# Patient Record
Sex: Female | Born: 1995 | Race: Black or African American | Hispanic: No | Marital: Single | Smoking: Never smoker
Health system: Southern US, Community
[De-identification: ages and names within clinical notes are randomized; demographics above are authoritative.]

## PROBLEM LIST (undated history)

## (undated) DIAGNOSIS — R632 Polyphagia: Secondary | ICD-10-CM

## (undated) DIAGNOSIS — F909 Attention-deficit hyperactivity disorder, unspecified type: Secondary | ICD-10-CM

## (undated) DIAGNOSIS — E669 Obesity, unspecified: Secondary | ICD-10-CM

## (undated) HISTORY — PX: NO PAST SURGERIES: SHX2092

---
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
## 2018-02-22 ENCOUNTER — Encounter: Payer: Self-pay | Admitting: Emergency Medicine

## 2018-02-22 ENCOUNTER — Emergency Department: Payer: Medicaid Other

## 2018-02-22 ENCOUNTER — Other Ambulatory Visit: Payer: Self-pay

## 2018-02-22 ENCOUNTER — Emergency Department
Admission: EM | Admit: 2018-02-22 | Discharge: 2018-02-22 | Disposition: A | Discharge status: Home / Self Care | Payer: Medicaid Other | Attending: Emergency Medicine | Admitting: Emergency Medicine

## 2018-02-22 DIAGNOSIS — M778 Other enthesopathies, not elsewhere classified: Secondary | ICD-10-CM | POA: Diagnosis not present

## 2018-02-22 DIAGNOSIS — M25532 Pain in left wrist: Secondary | ICD-10-CM | POA: Diagnosis present

## 2018-02-22 MED ORDER — INDOMETHACIN 25 MG PO CAPS: 25.0000 mg | ORAL_CAPSULE | Freq: Two times a day (BID) | ORAL | 0 refills | Status: DC

## 2018-02-22 NOTE — ED Triage Notes (Signed)
Pt to ED via POV c/o left wrist pain x 1.5 weeks. Pt denies any known injury. Pt in NAD at this time.

## 2018-02-22 NOTE — ED Provider Notes (Signed)
[facility] Emergency Department Provider Note  ____________________________________________   First MD Initiated Contact with Patient 02/22/18 [phone]     (approximate)  I have reviewed the triage vital signs and the nursing notes.   HISTORY  Chief Complaint Wrist Pain   HPI Deborah Malone is a 22 y.o. female presents to the ED with complaint of left wrist pain for 1/2 weeks.  Patient states that there is been no history of injury.  Patient states that the ulnar aspect of her left wrist is painful especially with range of motion.  Is been taking acetaminophen without any relief of her pain.  Patient is left-hand dominant.  She denies any history of injury.  She rates her pain as 4/10.   History reviewed. No pertinent past medical history.  There are no active problems to display for this patient.   History reviewed. No pertinent surgical history.  Prior to Admission medications   Medication Sig Start Date End Date Taking? Authorizing Provider  indomethacin (INDOCIN) 25 MG capsule Take 1 capsule (25 mg total) by mouth 2 (two) times daily with a meal. 02/22/18   Tommi Rumps, PA-C    Allergies Patient has no known allergies.  No family history on file.  Social History Social History   Tobacco Use  . Smoking status: Never Smoker  . Smokeless tobacco: Never Used  Substance Use Topics  . Alcohol use: Not Currently  . Drug use: Not Currently    Review of Systems Constitutional: No fever/chills Cardiovascular: Denies chest pain. Respiratory: Denies shortness of breath. Musculoskeletal: Left hand pain. Skin: Negative for rash. Neurological: Negative for headaches, focal weakness or numbness. ___________________________________________   PHYSICAL EXAM:  VITAL SIGNS: ED Triage Vitals  Enc Vitals Group     BP 02/22/18 0756 131/68     Pulse Rate 02/22/18 0755 88     Resp 02/22/18 0755 16     Temp 02/22/18 0755 98.1 F (36.7 C)   Temp Source 02/22/18 0755 Oral     SpO2 02/22/18 0755 100 %     Weight 02/22/18 0755 269 lb (122 kg)     Height 02/22/18 0755  (1.753 m)     Head Circumference --      Peak Flow --      Pain Score 02/22/18 0755 4     Pain Loc --      Pain Edu? --      Excl. in GC? --    Constitutional: Alert and oriented. Well appearing and in no acute distress. Eyes: Conjunctivae are normal. Head: Atraumatic. Neck: No stridor.   Cardiovascular: Normal rate, regular rhythm. Grossly normal heart sounds.  Good peripheral circulation. Respiratory: Normal respiratory effort.  No retractions. Lungs CTAB. Musculoskeletal: On examination of left wrist there is no gross deformity and no erythema or warmth noted.  Range of motion is slightly restricted with extension reproducing the pain.  There is some minimal point tenderness on palpation on the ulnar aspect of her left wrist.  Motor sensory function intact to digits distal to this area.  Capillary refill is less than 3 seconds and skin is intact without any erythema. Neurologic:  Normal speech and language. No gross focal neurologic deficits are appreciated. No gait instability. Skin:  Skin is warm, dry and intact. No rash noted. Psychiatric: Mood and affect are normal. Speech and behavior are normal.  ____________________________________________   LABS (all labs ordered are listed, but only abnormal results are displayed)  Labs  Reviewed - No data to display  RADIOLOGY  ED MD interpretation:  Left wrist x-ray is negative.  Official radiology report(s): Dg Wrist Complete Left  Result Date: 02/22/2018 CLINICAL DATA:  Pain for 2 weeks EXAM: LEFT WRIST - COMPLETE 3+ VIEW COMPARISON:  None. FINDINGS: Frontal, oblique lateral, and ulnar deviation scaphoid images were obtained. There is no fracture or dislocation. The joint spaces appear normal. No erosive change or intra-articular calcification. IMPRESSION: No fracture or dislocation.  No evident  arthropathy. Electronically Signed   By: Bretta Bang III M.D.   On: 02/22/2018 08:34    ____________________________________________   PROCEDURES  Procedure(s) performed: None  Procedures  Critical Care performed: No  ____________________________________________   INITIAL IMPRESSION / ASSESSMENT AND PLAN / ED COURSE  As part of my medical decision making, I reviewed the following data within the electronic MEDICAL RECORD NUMBER Notes from prior ED visits and [place] Controlled Substance Database  Patient is here with complaint of left wrist pain x 1.5 weeks.  Patient was placed in a cock-up wrist splint and instructed to wear it for protection and comfort.  She was given a prescription for indomethacin 25 mg twice daily with food.  She is instructed to discontinue taking this medications.  She experience any GI side effects.  She will follow-up with her PCP in [place] if any continued problems.   ____________________________________________   FINAL CLINICAL IMPRESSION(S) / ED DIAGNOSES  Final diagnoses:  Left wrist tendonitis     ED Discharge Orders        Ordered    indomethacin (INDOCIN) 25 MG capsule  2 times daily with meals     02/22/18 0845       Note:  This document was prepared using Dragon voice recognition software and may include unintentional dictation errors.    Tommi Rumps, PA-C 02/22/18 8413    Jene Every, MD 02/22/18 [phone]

## 2018-02-22 NOTE — Discharge Instructions (Signed)
Taking medication twice a day with food as directed.  Indocin can cause stomach upset to discontinue if any nausea, vomiting or diarrhea.  Also discontinue immediately if you see any black stools.  We are cock-up wrist splint for support and protection.  You may ice as needed for discomfort.  Follow-up with your primary care doctor at [facility] if any continued problems after 1 week.

## 2018-02-22 NOTE — ED Notes (Signed)
See triage note  Presents with a 2 week hx of left wrist pain  Denies any injury that she is aware of  Stats she does a lot of repetitive motion and lifting  Has also had some intermittent swelling  No deformity noted  Good pulses

## 2019-01-25 IMAGING — DX DG WRIST COMPLETE 3+V*L*
4 series · 4 of 4 positions shown · non-contrast
Comparison: None.

CLINICAL DATA: Pain for 2 weeks

EXAM:
LEFT WRIST - COMPLETE 3+ VIEW

[wrist ap (1 of 2)]
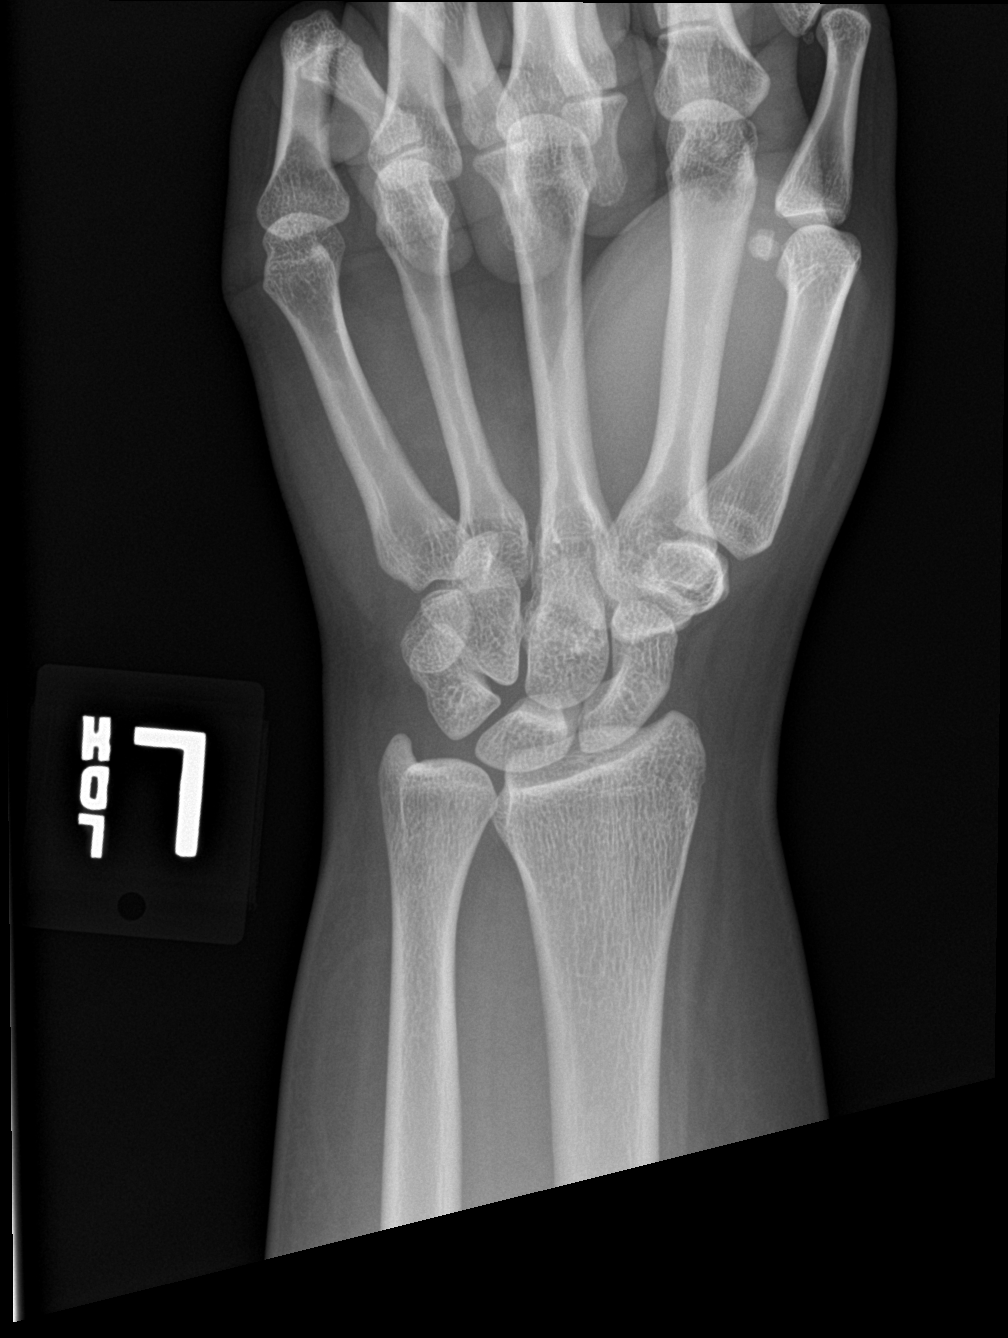

[wrist obl]
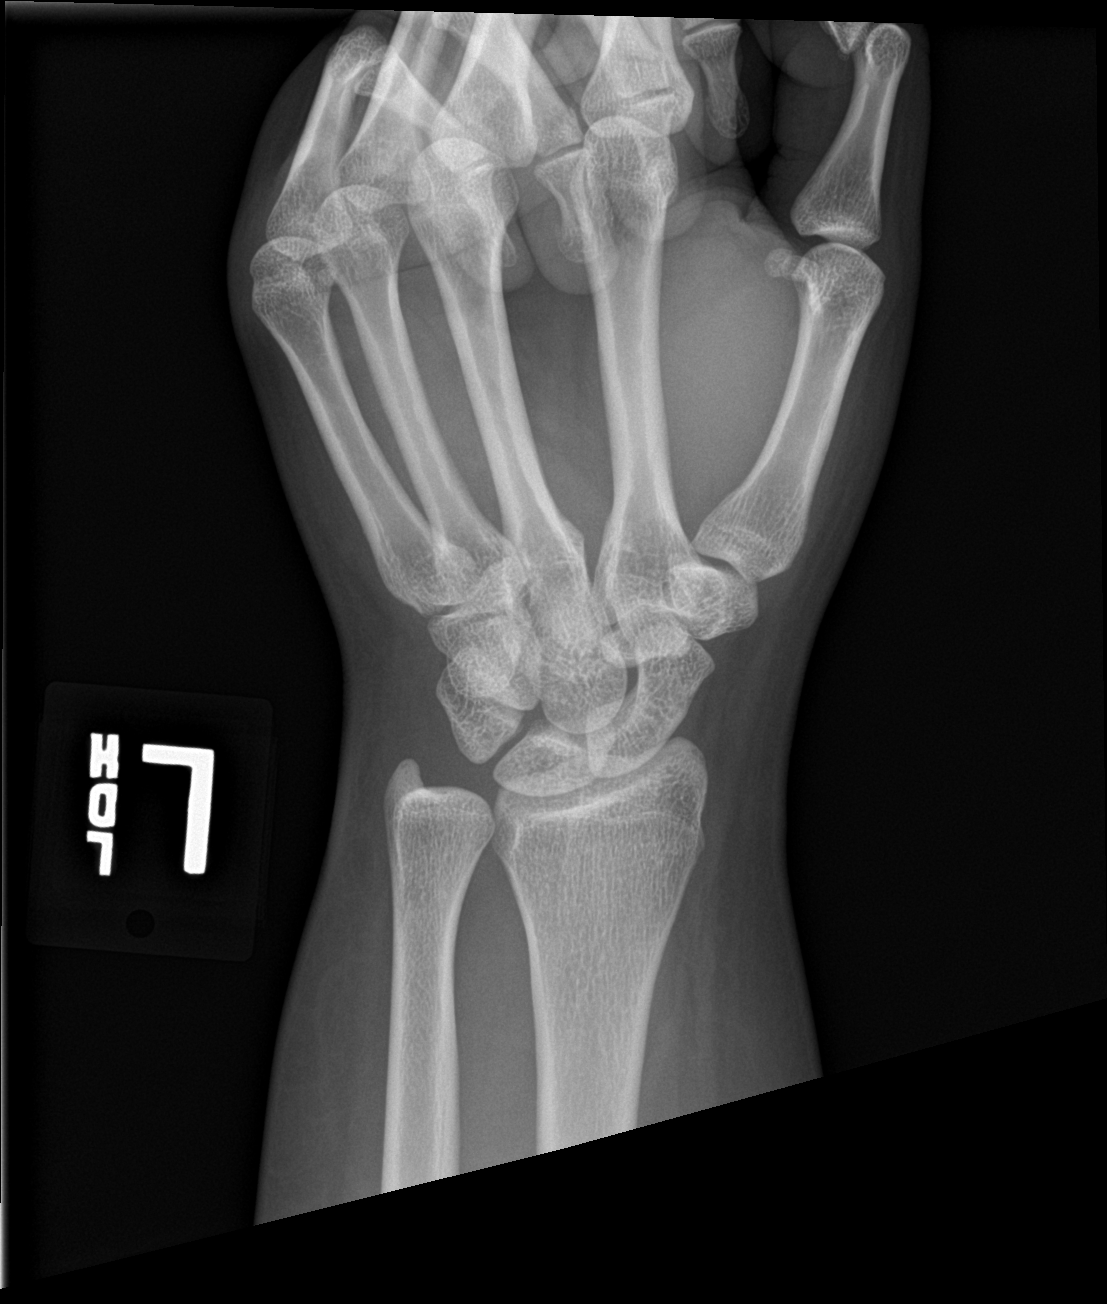

[wrist lat]
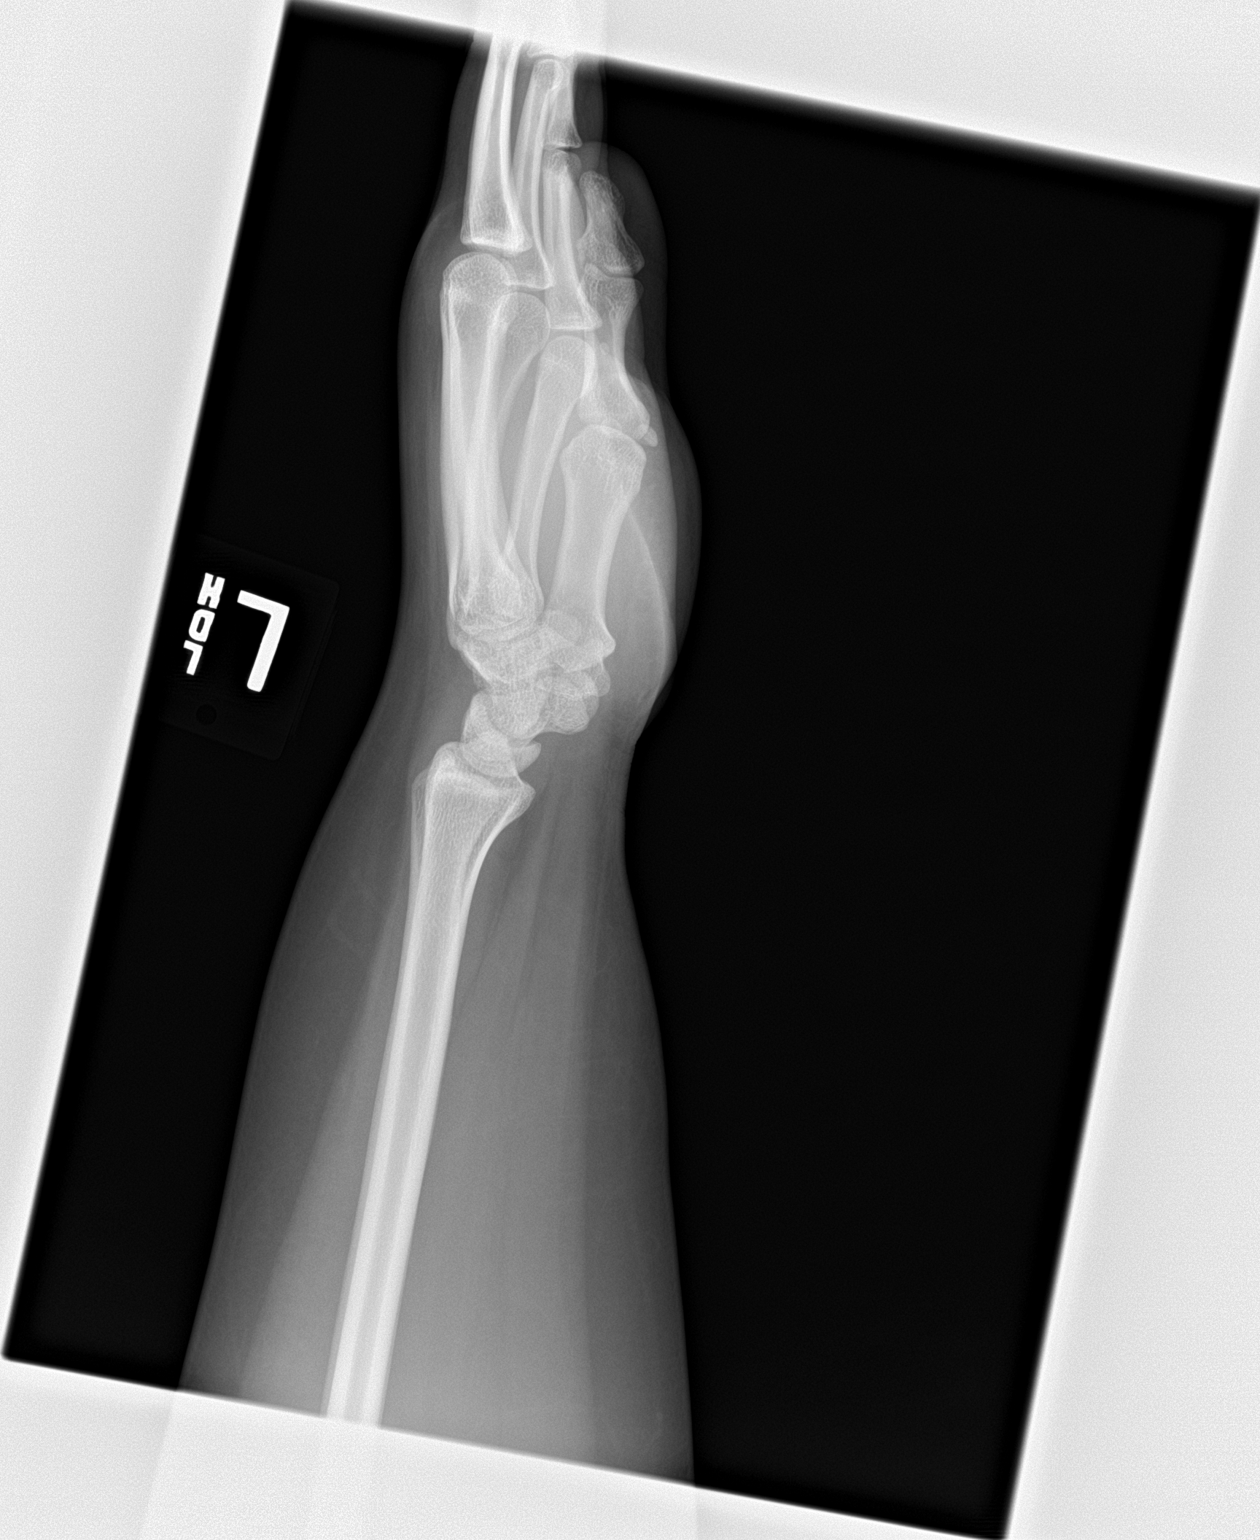

[wrist ap (2 of 2)]
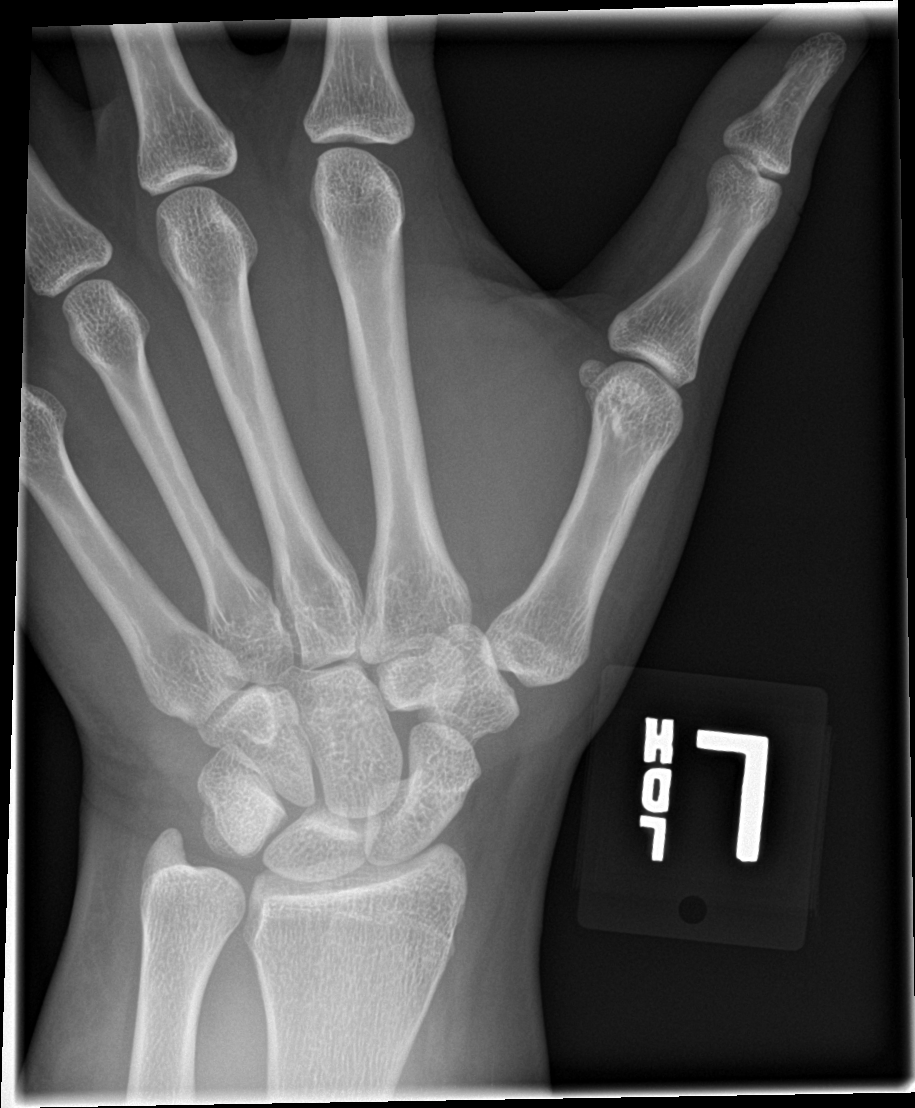

[4 of 4 positions shown; findings below may reference images not displayed]

FINDINGS: Frontal, oblique lateral, and ulnar deviation scaphoid images were
obtained. There is no fracture or dislocation. The joint spaces
appear normal. No erosive change or intra-articular calcification.
IMPRESSION: No fracture or dislocation.  No evident arthropathy.

## 2019-08-10 ENCOUNTER — Other Ambulatory Visit: Payer: Self-pay

## 2019-08-10 ENCOUNTER — Encounter: Payer: Self-pay | Admitting: Emergency Medicine

## 2019-08-10 ENCOUNTER — Ambulatory Visit
Admission: EM | Admit: 2019-08-10 | Discharge: 2019-08-10 | Disposition: A | Discharge status: Home / Self Care | Payer: Medicaid Other | Attending: Family Medicine | Admitting: Family Medicine

## 2019-08-10 DIAGNOSIS — J039 Acute tonsillitis, unspecified: Secondary | ICD-10-CM | POA: Insufficient documentation

## 2019-08-10 LAB — RAPID STREP SCREEN (MED CTR MEBANE ONLY): Streptococcus, Group A Screen (Direct): NEGATIVE

## 2019-08-10 MED ORDER — AMOXICILLIN 500 MG PO CAPS: 500.0000 mg | ORAL_CAPSULE | Freq: Two times a day (BID) | ORAL | 0 refills | Status: DC

## 2019-08-10 NOTE — Discharge Instructions (Signed)
It was very nice seeing you today in clinic. Thank you for entrusting me with your care.   Rest and Increase fluid intake as much as possible. Water is always best, as sugar and caffeine containing fluids can cause you to become dehydrated. Try to incorporate electrolyte enriched fluids, such as Gatorade or Pedialyte, into your daily fluid intake.  May use Tylenol and/or Ibuprofen as needed for pain/fever.  Antibiotics have been sent in - FINISH ALL OF THE PILLS.  Make arrangements to follow up with your regular doctor in 1 week for re-evaluation if not improving. If your symptoms/condition worsens, please seek follow up care either here or in the ER. Please remember, our [place] providers are "right here with you" when you need [place].   Again, it was my pleasure to take care of you today. Thank you for choosing our clinic. I hope that you start to feel better quickly.   Honor Loh, MSN, APRN, FNP-C, CEN Advanced Practice Provider [facility]

## 2019-08-10 NOTE — ED Provider Notes (Signed)
[place], [place]   Name: Deborah Malone DOB: 07/23/1996 MRN: [id number] CSN: [id number] PCColleen Can: [facility] [place] [facility]  Arrival date and time:  08/10/19 1626  Chief Complaint:  Sore Throat   NOTE: Prior to seeing the patient today, I have reviewed the triage nursing documentation and vital signs. Clinical staff has updated patient's PMH/PSHx, current medication list, and drug allergies/intolerances to ensure comprehensive history available to assist in medical decision making.   History:   HPI: Deborah Malone is a 23 y.o. female who presents today with complaints of sore throat that began with acute onset this morning.  Patient denies any associated fevers.  She is not experiencing shortness of breath, wheezing/stridor, or difficulty swallowing. She denies that she has experienced any nausea, vomiting, diarrhea, or abdominal pain. She is eating and drinking well. Patient denies any perceived alterations to her sense of taste or smell. Patient denies being in close contact with anyone known to be ill. She has never been tested for SARS-CoV-2 (novel coronavirus) per her report. Despite her symptoms, patient has not taken any over the counter interventions to help improve/relieve her reported symptoms at home.   History reviewed. No pertinent past medical history.  Past Surgical History:  Procedure Laterality Date  . NO PAST SURGERIES      Family History  Problem Relation Age of Onset  . Anxiety disorder Mother   . Depression Mother   . Post-traumatic stress disorder Mother   . Bipolar disorder Mother   . Other Father        unknown medical history    Social History   Tobacco Use  . Smoking status: Never Smoker  . Smokeless tobacco: Never Used  Substance Use Topics  . Alcohol use: Not Currently  . Drug use: Not Currently    There are no active problems to display for this patient.   Home Medications:    No outpatient medications have been marked as taking for the 08/10/19  encounter [facility](Hospital Encounter).    Allergies:   Patient has no known allergies.  Review of Systems (ROS): Review of Systems  Constitutional: Negative for fatigue and fever.  HENT: Positive for sore throat. Negative for congestion, ear pain, postnasal drip, rhinorrhea, sinus pressure, sinus pain and sneezing.   Eyes: Negative for pain, discharge and redness.  Respiratory: Negative for cough, chest tightness and shortness of breath.   Cardiovascular: Negative for chest pain and palpitations.  Gastrointestinal: Negative for abdominal pain, diarrhea, nausea and vomiting.  Musculoskeletal: Negative for arthralgias, back pain, myalgias and neck pain.  Skin: Negative for color change, pallor and rash.  Neurological: Negative for dizziness, syncope, weakness and headaches.  Hematological: Negative for adenopathy.     Vital Signs: Today's Vitals   08/10/19 1637 08/10/19 1638 08/10/19 1710  BP:  132/75   Pulse:  (!) 102   Resp:  18   Temp:  98.6 F (37 C)   TempSrc:  Oral   SpO2:  100%   Weight: 280 lb (127 kg)    Height: 5\' 8"  (1.727 m)    PainSc: 2   2     Physical Exam: Physical Exam  Constitutional: She is oriented to person, place, and time and well-developed, well-nourished, and in no distress. No distress.  HENT:  Head: Normocephalic and atraumatic.  Right Ear: Tympanic membrane normal.  Left Ear: Tympanic membrane normal.  Nose: Nose normal.  Mouth/Throat: Uvula is midline. Oropharyngeal exudate and posterior oropharyngeal erythema present.  RIGHT tonsil grade 3  and almost entirely covered with white exudate.  Eyes: Pupils are equal, round, and reactive to light. Conjunctivae and EOM are normal.  Neck: Normal range of motion. Neck supple.  Cardiovascular: Regular rhythm, normal heart sounds and intact distal pulses. Tachycardia present. Exam reveals no gallop and no friction rub.  No murmur heard. Pulmonary/Chest: Effort normal. No respiratory distress. She has no  decreased breath sounds. She has no wheezes. She has no rhonchi. She has no rales.  Abdominal: Soft. Bowel sounds are normal. She exhibits no distension. There is no abdominal tenderness.  Musculoskeletal: Normal range of motion.  Lymphadenopathy:       Head (right side): Tonsillar adenopathy present.  Neurological: She is alert and oriented to person, place, and time. Gait normal.  Skin: Skin is warm and dry. No rash noted. She is not diaphoretic.  Psychiatric: Mood, memory, affect and judgment normal.  Nursing note and vitals reviewed.   Urgent Care Treatments / Results:   LABS: PLEASE NOTE: all labs that were ordered this encounter are listed, however only abnormal results are displayed. Labs Reviewed  RAPID STREP SCREEN ([facility] [place] ONLY)  CULTURE, GROUP A STREP [facility])    EKG: -None  RADIOLOGY: No results found.  PROCEDURES: Procedures  MEDICATIONS RECEIVED THIS VISIT: Medications - No data to display  PERTINENT CLINICAL COURSE NOTES/UPDATES:   Initial Impression / Assessment and Plan / Urgent Care Course:  Pertinent labs & imaging results that were available during my care of the patient were personally reviewed by me and considered in my medical decision making (see lab/imaging section of note for values and interpretations).  Deborah Malone is a 23 y.o. female who presents to [facility] today with complaints of Sore Throat   Patient overall well appearing and in no acute distress today in clinic. Presenting symptoms (see HPI) and exam as documented above. She presents with symptoms associated with SARS-CoV-2 (novel coronavirus). Discussed typical symptom constellation. Reviewed potential for infection and need for testing. Patient amenable to being tested. SARS-CoV-2 swab collected by certified clinical staff. Discussed variable turn around times associated with testing, as swabs are being processed at [facility], and have been taking between 2-5 days to come  back. She was advised to self quarantine, per [facility] DHHS guidelines, until negative results received.   Rapid streptococcal throat swab (-); reflex culture sent.  Exam reveals significantly enlarged RIGHT tonsil with associated exudative coating.  She denies any shortness of breath or difficulty swallowing.  Patient has not experienced any fevers.  Symptoms started with acute onset today.  She does not have a history of recurrent streptococcal pharyngitis.  Given the appearance of her throat and tender tonsillar adenopathy, will proceed with treatment using a 10-day course of amoxicillin. Discussed supportive care measures at home during acute phase of illness. Patient to rest as much as possible. She was encouraged to ensure adequate hydration (water and ORS) to prevent dehydration and electrolyte derangements. Patient may use APAP and/or IBU on an as needed basis for pain/fever.  Offered topical analgesic interventions (viscous lidocaine and phenol spray), however patient refused these interventions.  Discussed follow up with primary care physician in 1 week for re-evaluation. I have reviewed the follow up and strict return precautions for any new or worsening symptoms. Patient is aware of symptoms that would be deemed urgent/emergent, and would thus require further evaluation either here or in the emergency department. At the time of discharge, she verbalized understanding and consent with the discharge plan  as it was reviewed with her. All questions were fielded by provider and/or clinic staff prior to patient discharge.    Final Clinical Impressions / Urgent Care Diagnoses:   Final diagnoses:  Exudative tonsillitis    New Prescriptions:  [employer] Controlled Substance Registry consulted? Not Applicable  Meds ordered this encounter  Medications  . amoxicillin (AMOXIL) 500 MG capsule    Sig: Take 1 capsule (500 mg total) by mouth 2 (two) times daily.    Dispense:  20 capsule    Refill:  0     Recommended Follow up Care:  Patient encouraged to follow up with the following provider within the specified time frame, or sooner as dictated by the severity of her symptoms. As always, she was instructed that for any urgent/emergent care needs, she should seek care either here or in the emergency department for more immediate evaluation.  Follow-up Information    [facility] [place] [facility] In 1 week.   Specialty: Family Medicine Why: General reassessment of symptoms if not improving Contact information: [address] [place] [address] [phone]         NOTE: This note was prepared using Dragon dictation software along with smaller phrase technology. Despite my best ability to proofread, there is the potential that transcriptional errors may still occur from this process, and are completely unintentional.    Verlee Monte, NP 08/11/19 2057

## 2019-08-10 NOTE — ED Triage Notes (Signed)
Patient in today c/o sore throat and white patches on her tonsils since this morning. Patient denies fever. Patient denies any other symptoms.

## 2019-08-13 LAB — CULTURE, GROUP A STREP (THRC)

## 2019-09-26 ENCOUNTER — Other Ambulatory Visit: Payer: Self-pay

## 2019-09-26 ENCOUNTER — Ambulatory Visit
Admission: EM | Admit: 2019-09-26 | Discharge: 2019-09-26 | Disposition: A | Discharge status: Home / Self Care | Payer: Medicaid Other | Attending: Urgent Care | Admitting: Urgent Care

## 2019-09-26 ENCOUNTER — Encounter: Payer: Self-pay | Admitting: Emergency Medicine

## 2019-09-26 DIAGNOSIS — Z113 Encounter for screening for infections with a predominantly sexual mode of transmission: Secondary | ICD-10-CM | POA: Diagnosis present

## 2019-09-26 DIAGNOSIS — N760 Acute vaginitis: Secondary | ICD-10-CM | POA: Diagnosis present

## 2019-09-26 DIAGNOSIS — B9689 Other specified bacterial agents as the cause of diseases classified elsewhere: Secondary | ICD-10-CM | POA: Insufficient documentation

## 2019-09-26 DIAGNOSIS — B373 Candidiasis of vulva and vagina: Secondary | ICD-10-CM | POA: Diagnosis present

## 2019-09-26 DIAGNOSIS — J039 Acute tonsillitis, unspecified: Secondary | ICD-10-CM | POA: Diagnosis present

## 2019-09-26 DIAGNOSIS — B3731 Acute candidiasis of vulva and vagina: Secondary | ICD-10-CM

## 2019-09-26 HISTORY — DX: Obesity, unspecified: E66.9

## 2019-09-26 LAB — URINALYSIS, COMPLETE (UACMP) WITH MICROSCOPIC
Bilirubin Urine: NEGATIVE
Glucose, UA: NEGATIVE mg/dL
Ketones, ur: NEGATIVE mg/dL
Nitrite: NEGATIVE
Protein, ur: NEGATIVE mg/dL
Specific Gravity, Urine: 1.02 (ref 1.005–1.030)
pH: 7 (ref 5.0–8.0)

## 2019-09-26 LAB — WET PREP, GENITAL
Sperm: NONE SEEN
Trich, Wet Prep: NONE SEEN

## 2019-09-26 LAB — RAPID STREP SCREEN (MED CTR MEBANE ONLY): Streptococcus, Group A Screen (Direct): NEGATIVE

## 2019-09-26 LAB — PREGNANCY, URINE: Preg Test, Ur: NEGATIVE

## 2019-09-26 MED ORDER — METRONIDAZOLE 500 MG PO TABS: 500.0000 mg | ORAL_TABLET | Freq: Two times a day (BID) | ORAL | 0 refills | Status: DC

## 2019-09-26 MED ORDER — FLUCONAZOLE 150 MG PO TABS: ORAL_TABLET | ORAL | 0 refills | Status: DC

## 2019-09-26 MED ORDER — AMOXICILLIN 500 MG PO CAPS: 500.0000 mg | ORAL_CAPSULE | Freq: Two times a day (BID) | ORAL | 0 refills | Status: DC

## 2019-09-26 NOTE — Discharge Instructions (Addendum)
It was very nice seeing you today in clinic. Thank you for entrusting me with your care.   Swab was POSITIVE for BV.  Gonorrhea and chlamydia testing pending - will call if positive.  Rapid strep negative; culture sent.   Take prescriptions as directed. Finish them all even if feeling better. DO NOT drink alcohol while taking the Flagyl, as it will make you very sick.   Make arrangements to follow up with your regular doctor in 1 week for re-evaluation if not improving. If your symptoms/condition worsens, please seek follow up care either here or in the ER. Please remember, our [place] providers are "right here with you" when you need [place].   Again, it was my pleasure to take care of you today. Thank you for choosing our clinic. I hope that you start to feel better quickly.   Honor Loh, MSN, APRN, FNP-C, CEN Advanced Practice Provider [facility]

## 2019-09-26 NOTE — ED Triage Notes (Addendum)
Patient in today c/o sore throat x 2 days and dysuria and frequency x 4 days. Patient denies vaginal discharge, fever. Patient was treated her for strep 08/10/19 and states the white spots are back.  Patient also requesting STD testing. Patient denies any symptoms, but its been over a year since she has been tested.

## 2019-09-27 LAB — GC/CHLAMYDIA PROBE AMP
Chlamydia trachomatis, NAA: NEGATIVE
Neisseria Gonorrhoeae by PCR: NEGATIVE

## 2019-09-27 NOTE — ED Provider Notes (Signed)
[place]   Name: Deborah Malone DOB: 1996/02/13 MRN: [id number] CSN: [id number] PCP: [facility]  Arrival date and time:  09/26/19 1645  Chief Complaint:  Sore Throat, Dysuria, and Urinary Frequency   NOTE: Prior to seeing the patient today, I have reviewed the triage nursing documentation and vital signs. Clinical staff has updated patient's PMH/PSHx, current medication list, and drug allergies/intolerances to ensure comprehensive history available to assist in medical decision making.   History:   HPI: Deborah Malone is a 23 y.o. female who presents today with multiple medical complaints as follows:  Sore throat: Patient with complaints of sore throat that began with acute onset 2 days ago.  Patient denies any associated fevers.  She is not experiencing shortness of breath, wheezing/stridor, or difficulty swallowing. Patient seen here on 08/10/2019 for the same; unilateral (RIGHT) tonsillar swelling with exudate. Patient presents today with BILATERAL tonsillar swelling with associated exudative patches. She denies that any nausea, vomiting, diarrhea, or abdominal pain. She is eating and drinking well.   Urinary: Patient also with complaints of urinary symptoms that began with acute onset 4 days ago. She complains of dysuria, frequency, and urgency. She has not appreciated any gross hematuria, nor has she noticed her urine being malodorous. Patient denies any associated nausea, vomiting, fever, and chills. She has not experienced any pain in her lower back, flank area, or abdomen. Patient advises that she does not have a past medical history that is significant for recurrent urinary tract infections. Patient endorses that she engages in unprotected sexual activity. She notes that sexual activity is in the context of a committed monogamous relationship with a single female partner. She denies any vaginal pain, bleeding, or discharge. Patient's last menstrual period was 07/30/2019  (approximate). There are no concerns that she is currently pregnant. She is requesting STI testing today citing that it has been over a year since her was tested.    Past Medical History:  Diagnosis Date   Obesity     Past Surgical History:  Procedure Laterality Date   NO PAST SURGERIES      Family History  Problem Relation Age of Onset   Anxiety disorder Mother    Depression Mother    Post-traumatic stress disorder Mother    Bipolar disorder Mother    Other Father        unknown medical history    Social History   Tobacco Use   Smoking status: Never Smoker   Smokeless tobacco: Never Used  Substance Use Topics   Alcohol use: Not Currently   Drug use: Not Currently    There are no active problems to display for this patient.   Home Medications:    No outpatient medications have been marked as taking for the 09/26/19 encounter [facility] Encounter).    Allergies:   Patient has no known allergies.  Review of Systems (ROS): Review of Systems  Constitutional: Negative for chills and fever.  HENT: Positive for sore throat. Negative for congestion, rhinorrhea, sinus pressure and sinus pain.   Respiratory: Negative for cough and shortness of breath.   Cardiovascular: Negative for chest pain and palpitations.  Gastrointestinal: Negative for abdominal pain, nausea and vomiting.  Genitourinary: Positive for dysuria, frequency and urgency. Negative for decreased urine volume, hematuria, pelvic pain, vaginal bleeding, vaginal discharge and vaginal pain.  Musculoskeletal: Negative for back pain.  Skin: Negative for color change, pallor and rash.  Neurological: Negative for dizziness, syncope, weakness and headaches.  All other  systems reviewed and are negative.    Vital Signs: Today's Vitals   09/26/19 1724 09/26/19 1726 09/26/19 1729 09/26/19 1847  BP: (!) 140/106  (!) 144/95   Pulse: 96     Resp: 18     Temp: 98.4 F (36.9 C)     TempSrc: Oral     SpO2:  100%     Weight:  290 lb (131.5 kg)    Height:  5\' 8"  (1.727 m)    PainSc:    1     Physical Exam: Physical Exam  Constitutional: She is oriented to person, place, and time and well-developed, well-nourished, and in no distress. No distress.  HENT:  Head: Normocephalic and atraumatic.  Right Ear: Tympanic membrane normal.  Left Ear: Tympanic membrane normal.  Nose: Nose normal.  Mouth/Throat: Uvula is midline and mucous membranes are normal. Oropharyngeal exudate and posterior oropharyngeal erythema present. No posterior oropharyngeal edema.  BILATERAL tonsils grade II with significant exudate noted.  Eyes: Pupils are equal, round, and reactive to light. Conjunctivae and EOM are normal.  Neck: Normal range of motion. Neck supple.  Cardiovascular: Normal rate, regular rhythm, normal heart sounds and intact distal pulses. Exam reveals no gallop and no friction rub.  No murmur heard. Pulmonary/Chest: Effort normal. No respiratory distress. She has no decreased breath sounds. She has no wheezes. She has no rhonchi. She has no rales.  Abdominal: Soft. Normal appearance and bowel sounds are normal. She exhibits no distension. There is no abdominal tenderness. There is no CVA tenderness.  Genitourinary:    Genitourinary Comments: Exam deferred. No vaginal/pelvic pain or bleeding. Patient is not currently pregnant. She has elected to self collect specimen swab for wet prep and DNA probe for GC.   Musculoskeletal: Normal range of motion.  Lymphadenopathy:       Head (right side): Submandibular and tonsillar adenopathy present.       Head (left side): Submandibular and tonsillar adenopathy present.  Neurological: She is alert and oriented to person, place, and time. Gait normal.  Skin: Skin is warm and dry. No rash noted. She is not diaphoretic.  Psychiatric: Mood, memory, affect and judgment normal.  Nursing note and vitals reviewed.   Urgent Care Treatments / Results:   LABS: PLEASE  NOTE: all labs that were ordered this encounter are listed, however only abnormal results are displayed. Labs Reviewed  WET PREP, GENITAL - Abnormal; Notable for the following components:      Result Value   Yeast Wet Prep HPF POC PRESENT (*)    Clue Cells Wet Prep HPF POC PRESENT (*)    WBC, Wet Prep HPF POC MODERATE (*)    All other components within normal limits  URINALYSIS, COMPLETE (UACMP) WITH MICROSCOPIC - Abnormal; Notable for the following components:   Hgb urine dipstick TRACE (*)    Leukocytes,Ua SMALL (*)    Bacteria, UA FEW (*)    All other components within normal limits  GC/CHLAMYDIA PROBE AMP  RAPID STREP SCREEN ([facility] ONLY)  CULTURE, GROUP A STREP [facility]  PREGNANCY, URINE    EKG: -None  RADIOLOGY: No results found.  PROCEDURES: Procedures  MEDICATIONS RECEIVED THIS VISIT: Medications - No data to display  PERTINENT CLINICAL COURSE NOTES/UPDATES:   Initial Impression / Assessment and Plan / Urgent Care Course:  Pertinent labs & imaging results that were available during my care of the patient were personally reviewed by me and considered in my medical decision making (see lab/imaging section of  note for values and interpretations).  Deborah Malone is a 23 y.o. female who presents to [facility] today with complaints of Sore Throat, Dysuria, and Urinary Frequency   Patient is well appearing overall in clinic today. She does not appear to be in any acute distress. Presenting symptoms (see HPI) and exam as documented above. Patient with multiple complaints. Workup and treatment as follows:   Sore throat: Patient treated in 07/2019 for the same and improved significantly after a few days on amoxicillin. Today she presents with BILATERAL tonsillar involvement.  Rapid streptococcal throat swab (-); reflex culture sent. Based on clinical exam, will treat for tonsillitis with a 10 day course of amoxicillin. Discussed supportive care using APAP/IBU  and salt water gargles. Dicussed with patient that future recurrences will necessitate evaluation by ENT to discuss need for possible tonsillectomy.   Urinary: Patient with urinary symptoms x 4 days.  Urine HCG (-) for pregnancy. UA (-) for infection. DNA probe for GC collected and sent to the lab for testing. Patient aware that she will be contacted with further treatment directives should her testing result as positive. Wet prep (+) for clue cells and candida, which is consistent with bacterial vaginosis (BV) and yeast infections. Will treat with a 7 day course of oral metronidazole and fluconazole Patient encouraged to complete the entire course of antibiotics even if she begins to feel better. Educated on need to avoid all ETOH while she is taking this medication in order to prevent a disulfiram like reaction that will result in significant nausea and vomiting. Patient encouraged to increase her fluid intake as much as possible. Discussed that water is always best to flush the urinary tract and prevent development of a urinary tract infection while on treatment for the BV.  Discussed follow up with primary care physician in 1 week for re-evaluation. I have reviewed the follow up and strict return precautions for any new or worsening symptoms. Patient is aware of symptoms that would be deemed urgent/emergent, and would thus require further evaluation either here or in the emergency department. At the time of discharge, she verbalized understanding and consent with the discharge plan as it was reviewed with her. All questions were fielded by provider and/or clinic staff prior to patient discharge.    Final Clinical Impressions / Urgent Care Diagnoses:   Final diagnoses:  Exudative tonsillitis  BV (bacterial vaginosis)  Vulvovaginal candidiasis  Screen for STD (sexually transmitted disease)    New Prescriptions:  [place] Controlled Substance Registry consulted? Not Applicable  Meds ordered this  encounter  Medications   amoxicillin (AMOXIL) 500 MG capsule    Sig: Take 1 capsule (500 mg total) by mouth 2 (two) times daily.    Dispense:  20 capsule    Refill:  0   fluconazole (DIFLUCAN) 150 MG tablet    Sig: Take 1 tablet (150 mg) PO x 1 dose. May repeat 150 mg dose in 3 days if still symptomatic.    Dispense:  2 tablet    Refill:  0   metroNIDAZOLE (FLAGYL) 500 MG tablet    Sig: Take 1 tablet (500 mg total) by mouth 2 (two) times daily.    Dispense:  14 tablet    Refill:  0    Recommended Follow up Care:  Patient encouraged to follow up with the following provider within the specified time frame, or sooner as dictated by the severity of her symptoms. As always, she was instructed that for any urgent/emergent  care needs, she should seek care either here or in the emergency department for more immediate evaluation.  Follow-up Information    [facility] In 1 week.   Specialty: Family Medicine Why: General reassessment of symptoms if not improving Contact information: [address] [place] [address] [phone]         NOTE: This note was prepared using Dragon dictation software along with smaller phrase technology. Despite my best ability to proofread, there is the potential that transcriptional errors may still occur from this process, and are completely unintentional.    Verlee Monte, NP 09/27/19 1724

## 2019-09-29 LAB — CULTURE, GROUP A STREP (THRC)

## 2020-03-25 ENCOUNTER — Other Ambulatory Visit: Payer: Self-pay

## 2020-03-25 ENCOUNTER — Ambulatory Visit
Admission: EM | Admit: 2020-03-25 | Discharge: 2020-03-25 | Disposition: A | Discharge status: Home / Self Care | Payer: Medicaid Other | Attending: Family Medicine | Admitting: Family Medicine

## 2020-03-25 DIAGNOSIS — J029 Acute pharyngitis, unspecified: Secondary | ICD-10-CM | POA: Diagnosis present

## 2020-03-25 DIAGNOSIS — N898 Other specified noninflammatory disorders of vagina: Secondary | ICD-10-CM | POA: Diagnosis present

## 2020-03-25 LAB — WET PREP, GENITAL
Clue Cells Wet Prep HPF POC: NONE SEEN
Sperm: NONE SEEN
Trich, Wet Prep: NONE SEEN
Yeast Wet Prep HPF POC: NONE SEEN

## 2020-03-25 LAB — CHLAMYDIA/NGC RT PCR (ARMC ONLY)
Chlamydia Tr: NOT DETECTED
N gonorrhoeae: NOT DETECTED

## 2020-03-25 LAB — GROUP A STREP BY PCR: Group A Strep by PCR: NOT DETECTED

## 2020-03-25 NOTE — ED Provider Notes (Signed)
[facility]    CSN: [id number] Arrival date & time: 03/25/20  1235      History   Chief Complaint Chief Complaint  Patient presents with  . Vaginal Itching  . Sore Throat    HPI Deborah Malone is a 24 y.o. female.   24 yo female with a c/o mild vaginal itching since yesterday. States she was recently treated for BV and yeast vaginitis last week and wanted to make sure it didn't come back. States she's concerned about STDs and would like to get checked for that.   Also c/o sore throat and "white patches" since yesterday. Denies any fevers, chills, trouble swallowing, drooling or shortness of breath.    Vaginal Itching  Sore Throat    Past Medical History:  Diagnosis Date  . Obesity     There are no problems to display for this patient.   Past Surgical History:  Procedure Laterality Date  . NO PAST SURGERIES      OB History   No obstetric history on file.      Home Medications    Prior to Admission medications   Not on File    Family History Family History  Problem Relation Age of Onset  . Anxiety disorder Mother   . Depression Mother   . Post-traumatic stress disorder Mother   . Bipolar disorder Mother   . Other Father        unknown medical history    Social History Social History   Tobacco Use  . Smoking status: Never Smoker  . Smokeless tobacco: Never Used  Substance Use Topics  . Alcohol use: Not Currently  . Drug use: Not Currently     Allergies   Patient has no known allergies.   Review of Systems Review of Systems   Physical Exam Triage Vital Signs ED Triage Vitals [03/25/20 1256]  Enc Vitals Group     BP (!) 135/106     Pulse Rate (!) 102     Resp 18     Temp 98.7 F (37.1 C)     Temp src      SpO2 100 %     Weight      Height      Head Circumference      Peak Flow      Pain Score 0     Pain Loc      Pain Edu?      Excl. in [place]?    No data found.  Updated Vital Signs BP (!) 135/106    Pulse (!) 102   Temp 98.7 F (37.1 C)   Resp 18   LMP 03/17/2020   SpO2 100%   Visual Acuity Right Eye Distance:   Left Eye Distance:   Bilateral Distance:    Right Eye Near:   Left Eye Near:    Bilateral Near:     Physical Exam Vitals and nursing note reviewed.  Constitutional:      General: She is not in acute distress.    Appearance: She is not toxic-appearing or diaphoretic.  HENT:     Mouth/Throat:     Pharynx: Posterior oropharyngeal erythema present. No oropharyngeal exudate.  Cardiovascular:     Rate and Rhythm: Normal rate.  Pulmonary:     Effort: Pulmonary effort is normal. No respiratory distress.  Neurological:     Mental Status: She is alert.      UC Treatments / Results  Labs (all labs ordered are listed,  but only abnormal results are displayed) Labs Reviewed  WET PREP, GENITAL - Abnormal; Notable for the following components:      Result Value   WBC, Wet Prep HPF POC MODERATE (*)    All other components within normal limits  GROUP A STREP BY PCR  CHLAMYDIA/NGC RT PCR ([facility] ONLY)  CHLAMYDIA/NGC RT PCR ([facility] ONLY)  CT/GC NAA, PHARYNGEAL    EKG   Radiology No results found.  Procedures Procedures (including critical care time)  Medications Ordered in UC Medications - No data to display  Initial Impression / Assessment and Plan / UC Course  I have reviewed the triage vital signs and the nursing notes.  Pertinent labs & imaging results that were available during my care of the patient were reviewed by me and considered in my medical decision making (see chart for details).      Final Clinical Impressions(s) / UC Diagnoses   Final diagnoses:  Pharyngitis, unspecified etiology  Vaginal itching     Discharge Instructions     Salt water gargles, over the counter medications as needed Await other test results    ED Prescriptions    None      1. Lab results and diagnosis reviewed with patient/ 2. Check  gc/chlamydia 3. Recommend supportive treatment with otc medications prn 4. Follow-up prn if symptoms worsen or don't improve   PDMP not reviewed this encounter.   Payton Mccallum, MD 03/25/20 1714

## 2020-03-25 NOTE — ED Triage Notes (Signed)
Pt treated for BV and yeast infection last week, started having vaginal itching again yesterday.   Pt also complaining of sore throat with white patches on tonsils, no fever, feels the same as last time having strep

## 2020-03-25 NOTE — Discharge Instructions (Signed)
Salt water gargles, over the counter medications as needed Await other test results

## 2020-03-27 LAB — CT/GC NAA, PHARYNGEAL
C TRACH RRNA NPH QL PCR: NEGATIVE
N GONORRHOEA RRNA NPH QL PCR: NEGATIVE

## 2020-08-06 ENCOUNTER — Ambulatory Visit
Admission: EM | Admit: 2020-08-06 | Discharge: 2020-08-06 | Disposition: A | Discharge status: Home / Self Care | Payer: Medicaid Other | Attending: Family Medicine | Admitting: Family Medicine

## 2020-08-06 ENCOUNTER — Other Ambulatory Visit: Payer: Self-pay

## 2020-08-06 ENCOUNTER — Encounter: Payer: Self-pay | Admitting: Emergency Medicine

## 2020-08-06 DIAGNOSIS — R300 Dysuria: Secondary | ICD-10-CM | POA: Insufficient documentation

## 2020-08-06 DIAGNOSIS — N3001 Acute cystitis with hematuria: Secondary | ICD-10-CM | POA: Diagnosis not present

## 2020-08-06 DIAGNOSIS — R350 Frequency of micturition: Secondary | ICD-10-CM

## 2020-08-06 HISTORY — DX: Attention-deficit hyperactivity disorder, unspecified type: F90.9

## 2020-08-06 HISTORY — DX: Polyphagia: R63.2

## 2020-08-06 LAB — URINALYSIS, COMPLETE (UACMP) WITH MICROSCOPIC
Glucose, UA: NEGATIVE mg/dL
Ketones, ur: 15 mg/dL — AB
Nitrite: NEGATIVE
Protein, ur: 100 mg/dL — AB
Specific Gravity, Urine: 1.02 (ref 1.005–1.030)
pH: 5.5 (ref 5.0–8.0)

## 2020-08-06 MED ORDER — NITROFURANTOIN MONOHYD MACRO 100 MG PO CAPS: 100.0000 mg | ORAL_CAPSULE | Freq: Two times a day (BID) | ORAL | 0 refills | Status: AC

## 2020-08-06 NOTE — Discharge Instructions (Addendum)

## 2020-08-06 NOTE — ED Provider Notes (Signed)
[facility]    CSN: [id number] Arrival date & time: 08/06/20  1831      History   Chief Complaint Chief Complaint  Patient presents with  . Urinary Frequency  . Dysuria    HPI Deborah Malone is a 24 y.o. female presenting for urinary frequency and dysuria off and on for the past month.  She says symptoms improved whenever she increases her water intake.  She denies any associated fever, chills, fatigue, lower abdominal pain/pelvic pain, back pain, vaginal discharge, hematuria.  Patient is sexually active and denies any concern for STIs.  She admits to irregular periods over the past year with last.  Starting a few days ago.  Patient is currently on her menstrual cycle.  She has not taken any medication for symptoms.  Patient says she has not been worked up for her irregular periods no other concerns.  HPI  Past Medical History:  Diagnosis Date  . ADHD   . Binge eating   . Obesity     There are no problems to display for this patient.   Past Surgical History:  Procedure Laterality Date  . NO PAST SURGERIES      OB History   No obstetric history on file.      Home Medications    Prior to Admission medications   Medication Sig Start Date End Date Taking? Authorizing Provider  lisdexamfetamine (VYVANSE) 50 MG capsule Take 1 capsule by mouth daily. 03/02/18  Yes [provider]  nitrofurantoin, macrocrystal-monohydrate, (MACROBID) 100 MG capsule Take 1 capsule (100 mg total) by mouth 2 (two) times daily for 5 days. 08/06/20 08/11/20  Shirlee Latch, PA-C    Family History Family History  Problem Relation Age of Onset  . Anxiety disorder Mother   . Depression Mother   . Post-traumatic stress disorder Mother   . Bipolar disorder Mother   . Other Father        unknown medical history    Social History Social History   Tobacco Use  . Smoking status: Never Smoker  . Smokeless tobacco: Never Used  Vaping Use  . Vaping Use: Some days  .  Substances: Nicotine, THC, Flavoring  Substance Use Topics  . Alcohol use: Yes    Comment: social  . Drug use: Not Currently     Allergies   Patient has no known allergies.   Review of Systems Review of Systems  Constitutional: Negative for fatigue and fever.  Gastrointestinal: Negative for abdominal pain, nausea and vomiting.  Genitourinary: Positive for dysuria and frequency. Negative for flank pain, hematuria, pelvic pain and vaginal discharge.  Musculoskeletal: Negative for back pain.  Skin: Negative for rash.  Neurological: Negative for weakness.     Physical Exam Triage Vital Signs ED Triage Vitals  Enc Vitals Group     BP 08/06/20 1903 (!) 138/98     Pulse Rate 08/06/20 1903 (!) 128     Resp 08/06/20 1903 18     Temp 08/06/20 1903 99.4 F (37.4 C)     Temp Source 08/06/20 1903 Oral     SpO2 08/06/20 1903 100 %     Weight 08/06/20 1904 290 lb (131.5 kg)     Height 08/06/20 1904 5\' 9"  (1.753 m)     Head Circumference --      Peak Flow --      Pain Score 08/06/20 1903 0     Pain Loc --      Pain Edu? --  Excl. in [place]? --    No data found.  Updated Vital Signs BP (!) 138/98 (BP Location: Left Arm)   Pulse (!) 128   Temp 99.4 F (37.4 C) (Oral)   Resp 18   Ht 5\' 9"  (1.753 m)   Wt 290 lb (131.5 kg)   LMP 08/05/2020 (Exact Date)   SpO2 100%   BMI 42.83 kg/m       Physical Exam Vitals and nursing note reviewed.  Constitutional:      General: She is not in acute distress.    Appearance: Normal appearance. She is obese. She is not ill-appearing or toxic-appearing.  HENT:     Head: Normocephalic and atraumatic.  Eyes:     General: No scleral icterus.       Right eye: No discharge.        Left eye: No discharge.     Conjunctiva/sclera: Conjunctivae normal.  Cardiovascular:     Rate and Rhythm: Regular rhythm. Tachycardia present.     Heart sounds: Normal heart sounds.  Pulmonary:     Effort: Pulmonary effort is normal. No respiratory distress.      Breath sounds: Normal breath sounds.  Musculoskeletal:     Cervical back: Neck supple.  Skin:    General: Skin is dry.  Neurological:     General: No focal deficit present.     Mental Status: She is alert. Mental status is at baseline.     Motor: No weakness.     Gait: Gait normal.  Psychiatric:        Mood and Affect: Mood normal.        Behavior: Behavior normal.        Thought Content: Thought content normal.      UC Treatments / Results  Labs (all labs ordered are listed, but only abnormal results are displayed) Labs Reviewed  URINALYSIS, COMPLETE (UACMP) WITH MICROSCOPIC - Abnormal; Notable for the following components:      Result Value   APPearance HAZY (*)    Hgb urine dipstick LARGE (*)    Bilirubin Urine SMALL (*)    Ketones, ur 15 (*)    Protein, ur 100 (*)    Leukocytes,Ua SMALL (*)    Bacteria, UA FEW (*)    All other components within normal limits    EKG   Radiology No results found.  Procedures Procedures (including critical care time)  Medications Ordered in UC Medications - No data to display  Initial Impression / Assessment and Plan / UC Course  I have reviewed the triage vital signs and the nursing notes.  Pertinent labs & imaging results that were available during my care of the patient were reviewed by me and considered in my medical decision making (see chart for details).   24 year old female presenting for 1 month intermittent history of dysuria and urinary frequency.  Her pulse rate is elevated at 128 today, but she does admit to anxiety surrounding doctors appointments and also takes Vyvanse for her ADHD.  She says both of those things elevate her heart rate.  Blood pressure is elevated at 138/98 I informed patient of this.  Of asked her to keep diet and follow-up with PCP.  Urinalysis performed today shows large blood and protein as well as some ketones.  There are also small leukocytes present.  Patient is currently on her menstrual  cycle which could explain some of the blood and protein.  Urine sent for culture.  Treating patient for suspected  urinary tract infection at this time with Macrobid.  Advised patient that she should follow-up with PCP or gynecologist regarding her irregular menstrual cycle.  Discussed different causes of irregular menstrual cycles including PCOS, obesity, stress, other hormonal conditions.  ED precautions discussed for any acute worsening symptoms.   Final Clinical Impressions(s) / UC Diagnoses   Final diagnoses:  Acute cystitis with hematuria  Dysuria  Urinary frequency     Discharge Instructions     UTI: Based on either symptoms or urinalysis, you may have a urinary tract infection. We will send the urine for culture and call with results in a few days. Begin antibiotics at this time. Your symptoms should be much improved over the next 2-3 days. Increase rest and fluid intake. If for some reason symptoms are worsening or not improving after a couple of days or the urine culture determines the antibiotics you are taking will not treat the infection, the antibiotics may be changed. Return or go to ER for fever, back pain, worsening urinary pain, discharge, increased blood in urine. May take Tylenol or Motrin OTC for pain relief or consider AZO if no contraindications     ED Prescriptions    Medication Sig Dispense Auth. Provider   nitrofurantoin, macrocrystal-monohydrate, (MACROBID) 100 MG capsule Take 1 capsule (100 mg total) by mouth 2 (two) times daily for 5 days. 10 capsule Shirlee Latch, PA-C     PDMP not reviewed this encounter.   Shirlee Latch, PA-C 08/07/20 1003

## 2020-08-06 NOTE — ED Triage Notes (Signed)
Patient in today c/o urinary frequency, dysuria and the feeling of not emptying her bladder off & on x 1 month. Patient denies any vaginal symptoms.

## 2020-08-07 LAB — URINE CULTURE

## 2021-03-21 ENCOUNTER — Ambulatory Visit
Admission: EM | Admit: 2021-03-21 | Discharge: 2021-03-21 | Disposition: A | Discharge status: Home / Self Care | Payer: Worker's Compensation | Attending: Sports Medicine | Admitting: Sports Medicine

## 2021-03-21 ENCOUNTER — Encounter: Payer: Self-pay | Admitting: Emergency Medicine

## 2021-03-21 ENCOUNTER — Other Ambulatory Visit: Payer: Self-pay

## 2021-03-21 DIAGNOSIS — S61213A Laceration without foreign body of left middle finger without damage to nail, initial encounter: Secondary | ICD-10-CM

## 2021-03-21 DIAGNOSIS — Z23 Encounter for immunization: Secondary | ICD-10-CM

## 2021-03-21 DIAGNOSIS — S6992XA Unspecified injury of left wrist, hand and finger(s), initial encounter: Secondary | ICD-10-CM | POA: Diagnosis not present

## 2021-03-21 MED ORDER — TETANUS-DIPHTH-ACELL PERTUSSIS 5-2.5-18.5 LF-MCG/0.5 IM SUSY
0.5000 mL | PREFILLED_SYRINGE | Freq: Once | INTRAMUSCULAR | Status: AC
  Administered 2021-03-21: 0.5 mL via INTRAMUSCULAR

## 2021-03-21 NOTE — ED Triage Notes (Signed)
Patient states that she cut her left 3rd finger on the bottom metal piece of a propane tank while at work today.  Patient has a small laceration to her left 3rd fingertip.  Patient has minimal bleeding.

## 2021-03-21 NOTE — Discharge Instructions (Addendum)
You have a small laceration of your left third finger.  We used Dermabond.  Please see educational handouts on care. You need to follow-up at occupational health at [facility] on Tuesday, May 31. Please keep that area clean and dry. Since I do not have a job description and you tell me that you are not working on Saturday, May 28, and Monday, May 30, I will take you out of work Sunday, May 29 so that you will not be working until you are seen at occupational health.  Please keep the area clean and dry.

## 2021-03-21 NOTE — ED Provider Notes (Signed)
[facility]    CSN: [id number] Arrival date & time: 03/21/21  1810      History   Chief Complaint Chief Complaint  Patient presents with  . Worker's Comp Injury  . Finger Injury  . Laceration    HPI Deborah Malone is a 25 y.o. female.   25 year old left-hand-dominant female who presents for evaluation of an injury to her left third finger.  She works over at [employer].  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  Date of injury was today 03/21/2021.  Time of the injury was 4:20 PM.  She reports that she was exchanging a propane tank and she got her finger caught between the tank and a shelving unit and it pinched her finger.  Subsequently sustained a laceration to the radial aspect of the third finger over the distal phalanx near the nail but did not involve the cuticle.  She reported the injury and was told to come to the urgent care.  Unfortunately it was after hours and the occupational health department at [facility] was closed.  She reports that she is unsure if she needs a drug test but I told her that we do not perform them at our facility.  She does not believe that she has a foreign body in the laceration.     Past Medical History:  Diagnosis Date  . ADHD   . Binge eating   . Obesity     There are no problems to display for this patient.   Past Surgical History:  Procedure Laterality Date  . NO PAST SURGERIES      OB History   No obstetric history on file.      Home Medications    Prior to Admission medications   Medication Sig Start Date End Date Taking? Authorizing Provider  lisdexamfetamine (VYVANSE) 50 MG capsule Take 1 capsule by mouth daily. 03/02/18   [provider]    Family History Family History  Problem Relation Age of Onset  . Anxiety disorder Mother   . Depression Mother   . Post-traumatic stress disorder Mother   . Bipolar disorder Mother   . Other Father        unknown medical history    Social History Social  History   Tobacco Use  . Smoking status: Never Smoker  . Smokeless tobacco: Never Used  Vaping Use  . Vaping Use: Some days  . Substances: Nicotine, THC, Flavoring  Substance Use Topics  . Alcohol use: Yes    Comment: social  . Drug use: Not Currently     Allergies   Patient has no known allergies.   Review of Systems Review of Systems  Constitutional: Negative for activity change, appetite change, chills, diaphoresis, fatigue and fever.  HENT: Negative for congestion, ear pain, postnasal drip, rhinorrhea, sinus pressure, sinus pain, sneezing and sore throat.   Eyes: Negative for pain.  Respiratory: Negative for cough, chest tightness and shortness of breath.   Cardiovascular: Negative for chest pain and palpitations.  Gastrointestinal: Negative for abdominal pain, diarrhea, nausea and vomiting.  Genitourinary: Negative for dysuria.  Musculoskeletal: Positive for arthralgias. Negative for back pain, joint swelling, myalgias and neck pain.  Skin: Positive for color change and wound. Negative for pallor and rash.  Neurological: Negative for dizziness, light-headedness and headaches.  All other systems reviewed and are negative.    Physical Exam Triage Vital Signs ED Triage Vitals  Enc Vitals Group     BP 03/21/21  1858 126/89     Pulse Rate 03/21/21 1858 79     Resp 03/21/21 1858 16     Temp 03/21/21 1858 98.7 F (37.1 C)     Temp Source 03/21/21 1858 Oral     SpO2 03/21/21 1858 100 %     Weight 03/21/21 1856 (!) 320 lb (145.2 kg)     Height 03/21/21 1856 5\' 9"  (1.753 m)     Head Circumference --      Peak Flow --      Pain Score 03/21/21 1855 0     Pain Loc --      Pain Edu? --      Excl. in GC? --    No data found.  Updated Vital Signs BP 126/89 (BP Location: Right Arm)   Pulse 79   Temp 98.7 F (37.1 C) (Oral)   Resp 16   Ht 5\' 9"  (1.753 m)   Wt (!) 145.2 kg   LMP 02/28/2021 (Approximate)   SpO2 100%   BMI 47.26 kg/m   Visual Acuity Right Eye  Distance:   Left Eye Distance:   Bilateral Distance:    Right Eye Near:   Left Eye Near:    Bilateral Near:     Physical Exam Vitals and nursing note reviewed.  Constitutional:      General: She is not in acute distress.    Appearance: Normal appearance. She is not ill-appearing, toxic-appearing or diaphoretic.  HENT:     Head: Normocephalic and atraumatic.     Nose: Nose normal.     Mouth/Throat:     Mouth: Mucous membranes are moist.  Eyes:     Conjunctiva/sclera: Conjunctivae normal.     Pupils: Pupils are equal, round, and reactive to light.  Cardiovascular:     Rate and Rhythm: Normal rate and regular rhythm.     Pulses: Normal pulses.     Heart sounds: Normal heart sounds. No murmur heard. No friction rub. No gallop.   Pulmonary:     Effort: Pulmonary effort is normal.     Breath sounds: Normal breath sounds. No stridor. No wheezing, rhonchi or rales.  Musculoskeletal:     Cervical back: Normal range of motion and neck supple.  Skin:    General: Skin is warm and dry.     Capillary Refill: Capillary refill takes less than 2 seconds.     Findings: Lesion present. No erythema.     Comments: Small linear laceration on the radial aspect of the third finger over the distal phalanx.  No evidence of foreign body.  It does not involve the cuticle or the nailbed.  She has good range of motion of the finger.  Flexor and extensor tendons are intact.  There is no ligamentous laxity.  Neurological:     General: No focal deficit present.     Mental Status: She is alert and oriented to person, place, and time.      UC Treatments / Results  Labs (all labs ordered are listed, but only abnormal results are displayed) Labs Reviewed - No data to display  EKG   Radiology No results found.  Procedures Laceration Repair  Date/Time: 03/21/2021 10:34 PM Performed by: 04/30/2021, MD Authorized by: 03/23/2021, MD   Consent:    Consent obtained:  Verbal   Consent  given by:  Patient   Risks, benefits, and alternatives were discussed: yes     Risks discussed:  Infection, pain, poor cosmetic result, poor wound  healing and need for additional repair   Alternatives discussed:  No treatment and observation Universal protocol:    Procedure explained and questions answered to patient or proxy's satisfaction: yes     Immediately prior to procedure, a time out was called: yes     Patient identity confirmed:  Arm band Anesthesia:    Anesthesia method:  None Laceration details:    Location:  Finger   Finger location:  L long finger   Length (cm):  1   Depth (mm):  1 Pre-procedure details:    Preparation:  Patient was prepped and draped in usual sterile fashion Exploration:    Limited defect created (wound extended): no     Hemostasis achieved with:  Direct pressure   Imaging outcome: foreign body not noted     Wound exploration: wound explored through full range of motion     Contaminated: no   Treatment:    Area cleansed with:  Povidone-iodine   Amount of cleaning:  Standard   Irrigation solution:  Sterile saline   Irrigation method:  Syringe   Visualized foreign bodies/material removed: no     Debridement:  None   Undermining:  None   Scar revision: no   Skin repair:    Repair method:  Tissue adhesive Approximation:    Approximation:  Close Repair type:    Repair type:  Simple Post-procedure details:    Dressing:  Open (no dressing)   Procedure completion:  Tolerated well, no immediate complications   (including critical care time)  Medications Ordered in UC Medications  Tdap (BOOSTRIX) injection 0.5 mL (0.5 mLs Intramuscular Given 03/21/21 1902)    Initial Impression / Assessment and Plan / UC Course  I have reviewed the triage vital signs and the nursing notes.  Pertinent labs & imaging results that were available during my care of the patient were reviewed by me and considered in my medical decision making (see chart for  details).  Clinical impression: Laceration to the distal aspect of the third finger.  No damage to the nail or the nailbed or the cuticle.  Treatment plan: 1.  The findings and treatment plan were discussed in detail with the patient.  Patient was in agreement. 2.  I recommended using Dermabond instead of sutures.  Area was cleaned and draped in usual sterile fashion.  Dermabond was applied. 3.  Educational handouts were provided. 4.  Worker's Comp. medical status questionnaire was completed.  Patient had no real job description.  She only works 1 day between now and Tuesday, May 31.  I am just going to take her out of work and she can follow-up with occupational health at [facility] on that day. 5.  Over-the-counter meds as needed.  No meds were prescribed. 6.  Tetanus booster was given. 7.  Discharge from care in stable condition and follow-up here as needed and follow-up at [facility] on Tuesday, May 31.    Final Clinical Impressions(s) / UC Diagnoses   Final diagnoses:  Laceration of left middle finger without foreign body without damage to nail, initial encounter  Injury of finger of left hand, initial encounter     Discharge Instructions     You have a small laceration of your left third finger.  We used Dermabond.  Please see educational handouts on care. You need to follow-up at occupational health at [facility] on Tuesday, May 31. Please keep that area clean and dry. Since I do not have a job description and  you tell me that you are not working on Saturday, May 28, and Monday, May 30, I will take you out of work Sunday, May 29 so that you will not be working until you are seen at occupational health.  Please keep the area clean and dry.    ED Prescriptions    None     PDMP not reviewed this encounter.   Delton See, MD 03/21/21 2238

## 2021-04-04 ENCOUNTER — Other Ambulatory Visit: Payer: Self-pay

## 2021-04-04 ENCOUNTER — Ambulatory Visit
Admission: EM | Admit: 2021-04-04 | Discharge: 2021-04-04 | Disposition: A | Discharge status: Home / Self Care | Payer: Medicaid Other | Attending: Emergency Medicine | Admitting: Emergency Medicine

## 2021-04-04 ENCOUNTER — Encounter: Payer: Self-pay | Admitting: Emergency Medicine

## 2021-04-04 DIAGNOSIS — Z113 Encounter for screening for infections with a predominantly sexual mode of transmission: Secondary | ICD-10-CM

## 2021-04-04 DIAGNOSIS — Z711 Person with feared health complaint in whom no diagnosis is made: Secondary | ICD-10-CM | POA: Diagnosis not present

## 2021-04-04 NOTE — Discharge Instructions (Addendum)
Give [place] a working phone number so that we can contact you if needed. Refrain from sexual contact until you know your results and your partner(s) are treated if necessary.    The average incubation period for developing genital herpes after exposure is 4 days.  Be on the look out for the signs and symptoms we discussed.  Return here or see your doctor if you develop any of these and we can test at that time.

## 2021-04-04 NOTE — ED Triage Notes (Signed)
Patient states that she wants to be tested for Herpes.  Patient states that she had oral sex with a partner who later states that they had herpes.  Patient currently has nos ores or outbreaks at this time.

## 2021-04-04 NOTE — ED Provider Notes (Signed)
HPI  SUBJECTIVE:  Deborah Malone is a 25 y.o. female who presents for STD screening.  States that she had oral sex with a partner who is rumored to have HSV 6 days ago.  She is not sure if he has it or not.  She states that she did not see any lesions on him at the time of sexual contact.  She denies vaginal sex.  She denies oral,  genital sores, blisters, flulike symptoms, body aches, sore throat, urinary complaints, vaginal complaints.  She denies have any other sexual partners.  She would also like to be tested for other STDs as a baseline.  She has a history of gonorrhea and chlamydia.  No history of HIV, HSV, syphilis, trichomonas.  PMD: [facility]    Past Medical History:  Diagnosis Date   ADHD    Binge eating    Obesity     Past Surgical History:  Procedure Laterality Date   NO PAST SURGERIES      Family History  Problem Relation Age of Onset   Anxiety disorder Mother    Depression Mother    Post-traumatic stress disorder Mother    Bipolar disorder Mother    Other Father        unknown medical history    Social History   Tobacco Use   Smoking status: Never   Smokeless tobacco: Never  Vaping Use   Vaping Use: Some days   Substances: Nicotine, THC, Flavoring  Substance Use Topics   Alcohol use: Yes    Comment: social   Drug use: Not Currently    No current facility-administered medications for this encounter.  Current Outpatient Medications:    lisdexamfetamine (VYVANSE) 50 MG capsule, Take 1 capsule by mouth daily., Disp: , Rfl:   No Known Allergies   ROS  As noted in HPI.   Physical Exam  BP 129/77 (BP Location: Left Arm)   Pulse 80   Temp 98.6 F (37 C) (Oral)   Resp 14   Ht 5\' 9"  (1.753 m)   Wt 134.3 kg   LMP 03/14/2021 (Approximate)   SpO2 100%   BMI 43.71 kg/m   Constitutional: Well developed, well nourished, no acute distress Eyes:  EOMI, conjunctiva normal bilaterally HENT: Normocephalic, atraumatic,mucus membranes  moist Respiratory: Normal inspiratory effort Cardiovascular: Normal rate GI: nondistended skin: No rash, skin intact Musculoskeletal: no deformities Neurologic: Alert & oriented x 3, no focal neuro deficits Psychiatric: Speech and behavior appropriate   ED Course   Medications - No data to display  No orders of the defined types were placed in this encounter.   No results found for this or any previous visit (from the past 24 hour(s)). No results found.  ED Clinical Impression  1. Concern about STD in female without diagnosis   2. Screen for STD (sexually transmitted disease)      ED Assessment/Plan  Discussed HSV signs and symptoms.  Patient is completely asymptomatic at this time.  Discussed with her that there are no indications for testing for HSV today.  Will send off testing for gonorrhea, chlamydia, trichomonas per patient request.  We do not have HIV or RPR available here at this time, advised patient that she can go to the health department and get these test for free.  Patient has MyChart.  Advised her that we will contact her if any of her labs come back abnormal.  Follow-up with PMD as needed.   No orders of the defined types  were placed in this encounter.     *This clinic note was created using Dragon dictation software. Therefore, there may be occasional mistakes despite careful proofreading.  ?    Domenick Gong, MD 04/04/21 1722

## 2021-04-07 ENCOUNTER — Ambulatory Visit
Admission: EM | Admit: 2021-04-07 | Discharge: 2021-04-07 | Disposition: A | Discharge status: Home / Self Care | Payer: Medicaid Other | Attending: Family Medicine | Admitting: Family Medicine

## 2021-04-07 ENCOUNTER — Telehealth (HOSPITAL_COMMUNITY): Payer: Self-pay | Admitting: Emergency Medicine

## 2021-04-07 ENCOUNTER — Other Ambulatory Visit: Payer: Self-pay

## 2021-04-07 DIAGNOSIS — A549 Gonococcal infection, unspecified: Secondary | ICD-10-CM

## 2021-04-07 LAB — CERVICOVAGINAL ANCILLARY ONLY
Chlamydia: POSITIVE — AB
Comment: NEGATIVE
Comment: NEGATIVE
Comment: NORMAL
Neisseria Gonorrhea: POSITIVE — AB
Trichomonas: POSITIVE — AB

## 2021-04-07 MED ORDER — DOXYCYCLINE HYCLATE 100 MG PO CAPS: 100.0000 mg | ORAL_CAPSULE | Freq: Two times a day (BID) | ORAL | 0 refills | Status: AC

## 2021-04-07 MED ORDER — METRONIDAZOLE 500 MG PO TABS: 500.0000 mg | ORAL_TABLET | Freq: Two times a day (BID) | ORAL | 0 refills | Status: AC

## 2021-04-07 MED ORDER — CEFTRIAXONE SODIUM 500 MG IJ SOLR
500.0000 mg | Freq: Once | INTRAMUSCULAR | Status: AC
  Administered 2021-04-07: 500 mg via INTRAMUSCULAR

## 2021-04-07 NOTE — ED Triage Notes (Signed)
Pt here to receive 500mg  IM Rocephin per treatment protocol.

## 2021-04-07 NOTE — Telephone Encounter (Signed)
Per protocol, patient will need treatment with IM Rocephin 500mg , Doxycycline, and Flagyl for positive Gonorrhea, Chlamydia and Trichomonas.  Attempted to reach patient x 1, LVM

## 2021-04-07 NOTE — ED Notes (Signed)
After obtaining consent, and per orders of Dr. Adriana Simas, injection of Rocephin given by Regis Bill. Patient instructed to remain in clinic for 20 minutes afterwards, and to report any adverse reaction to me immediately.
